# Patient Record
Sex: Female | Born: 1965 | Race: Black or African American | Hispanic: No | Marital: Single
Health system: Southern US, Community
[De-identification: ages and names within clinical notes are randomized; demographics above are authoritative.]

---

## 2004-09-02 ENCOUNTER — Emergency Department: Payer: Self-pay | Admitting: Emergency Medicine

## 2005-09-03 ENCOUNTER — Emergency Department: Payer: Self-pay | Admitting: Emergency Medicine

## 2006-09-26 ENCOUNTER — Emergency Department: Payer: Self-pay | Admitting: Emergency Medicine
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
## 2018-11-11 ENCOUNTER — Other Ambulatory Visit: Payer: Self-pay

## 2018-11-11 DIAGNOSIS — Z20822 Contact with and (suspected) exposure to covid-19: Secondary | ICD-10-CM

## 2018-11-12 LAB — NOVEL CORONAVIRUS, NAA: SARS-CoV-2, NAA: NOT DETECTED

## 2018-12-03 ENCOUNTER — Other Ambulatory Visit: Payer: Self-pay | Admitting: *Deleted

## 2018-12-03 DIAGNOSIS — Z20822 Contact with and (suspected) exposure to covid-19: Secondary | ICD-10-CM

## 2018-12-05 LAB — NOVEL CORONAVIRUS, NAA: SARS-CoV-2, NAA: NOT DETECTED

## 2018-12-07 ENCOUNTER — Telehealth: Payer: Self-pay | Admitting: General Practice

## 2018-12-07 NOTE — Telephone Encounter (Signed)
Negative COVID results given. Patient results "NOT Detected." Caller expressed understanding. ° °

## 2018-12-30 ENCOUNTER — Other Ambulatory Visit: Payer: Self-pay | Admitting: *Deleted

## 2018-12-30 DIAGNOSIS — Z20822 Contact with and (suspected) exposure to covid-19: Secondary | ICD-10-CM

## 2018-12-31 LAB — NOVEL CORONAVIRUS, NAA: SARS-CoV-2, NAA: NOT DETECTED

## 2019-01-04 ENCOUNTER — Telehealth: Payer: Self-pay | Admitting: General Practice

## 2019-01-04 NOTE — Telephone Encounter (Signed)
° °  Pt given neg COVID results  °

## 2019-01-25 ENCOUNTER — Other Ambulatory Visit: Payer: Self-pay

## 2019-01-25 DIAGNOSIS — Z20822 Contact with and (suspected) exposure to covid-19: Secondary | ICD-10-CM

## 2019-01-26 NOTE — Telephone Encounter (Signed)
Received call from patient checking Covid results.  Advised no resutls at this time.   

## 2019-01-27 LAB — NOVEL CORONAVIRUS, NAA: SARS-CoV-2, NAA: NOT DETECTED

## 2019-03-01 ENCOUNTER — Other Ambulatory Visit: Payer: Self-pay

## 2019-03-01 DIAGNOSIS — Z20822 Contact with and (suspected) exposure to covid-19: Secondary | ICD-10-CM

## 2019-03-02 ENCOUNTER — Telehealth: Payer: Self-pay

## 2019-03-02 LAB — NOVEL CORONAVIRUS, NAA: SARS-CoV-2, NAA: NOT DETECTED

## 2019-03-02 NOTE — Telephone Encounter (Signed)
Received call from patient checking Covid results.  Advised results negative.   

## 2019-04-15 ENCOUNTER — Other Ambulatory Visit: Payer: Self-pay

## 2019-04-16 ENCOUNTER — Ambulatory Visit: Payer: Self-pay | Attending: Internal Medicine

## 2019-04-16 DIAGNOSIS — Z20822 Contact with and (suspected) exposure to covid-19: Secondary | ICD-10-CM | POA: Insufficient documentation

## 2019-04-18 ENCOUNTER — Telehealth: Payer: Self-pay | Admitting: Hematology

## 2019-04-18 LAB — NOVEL CORONAVIRUS, NAA: SARS-CoV-2, NAA: NOT DETECTED

## 2019-04-18 NOTE — Telephone Encounter (Signed)
Pt is aware covid 19 test is neg on 04-09-2019

## 2019-05-03 ENCOUNTER — Ambulatory Visit: Payer: Self-pay | Attending: Internal Medicine

## 2019-05-03 DIAGNOSIS — Z20822 Contact with and (suspected) exposure to covid-19: Secondary | ICD-10-CM | POA: Insufficient documentation

## 2019-05-04 ENCOUNTER — Telehealth: Payer: Self-pay | Admitting: *Deleted

## 2019-05-04 LAB — NOVEL CORONAVIRUS, NAA: SARS-CoV-2, NAA: NOT DETECTED

## 2019-05-04 NOTE — Telephone Encounter (Signed)
Pt called to check status of covid 19 test results. She is advised that her results are not back at this time. She voiced understanding.

## 2019-05-06 ENCOUNTER — Telehealth: Payer: Self-pay | Admitting: *Deleted

## 2019-05-06 NOTE — Telephone Encounter (Signed)
Patient called to verify negative covid results. 

## 2019-05-31 ENCOUNTER — Ambulatory Visit: Payer: Self-pay

## 2019-05-31 ENCOUNTER — Ambulatory Visit: Payer: Self-pay | Attending: Internal Medicine

## 2019-05-31 DIAGNOSIS — Z20822 Contact with and (suspected) exposure to covid-19: Secondary | ICD-10-CM | POA: Insufficient documentation

## 2019-06-01 ENCOUNTER — Telehealth: Payer: Self-pay | Admitting: *Deleted

## 2019-06-01 LAB — SPECIMEN STATUS REPORT

## 2019-06-01 LAB — NOVEL CORONAVIRUS, NAA: SARS-CoV-2, NAA: NOT DETECTED

## 2019-06-01 NOTE — Telephone Encounter (Signed)
Patient called to request her Covid test result from 05/31/2019.  Patient informed covid test was negative.  Patient verbalized understanding.  Patient had no further questions.

## 2019-06-09 ENCOUNTER — Ambulatory Visit: Payer: Self-pay | Attending: Internal Medicine

## 2019-06-09 DIAGNOSIS — Z20822 Contact with and (suspected) exposure to covid-19: Secondary | ICD-10-CM | POA: Insufficient documentation

## 2019-06-10 LAB — NOVEL CORONAVIRUS, NAA: SARS-CoV-2, NAA: NOT DETECTED

## 2019-06-10 LAB — SPECIMEN STATUS REPORT

## 2019-06-24 ENCOUNTER — Ambulatory Visit: Payer: Self-pay | Attending: Internal Medicine

## 2019-06-24 DIAGNOSIS — Z20822 Contact with and (suspected) exposure to covid-19: Secondary | ICD-10-CM | POA: Insufficient documentation

## 2019-06-25 ENCOUNTER — Ambulatory Visit: Payer: Self-pay

## 2019-06-25 ENCOUNTER — Telehealth: Payer: Self-pay | Admitting: *Deleted

## 2019-06-25 ENCOUNTER — Ambulatory Visit: Payer: Self-pay | Attending: Internal Medicine

## 2019-06-25 DIAGNOSIS — Z23 Encounter for immunization: Secondary | ICD-10-CM

## 2019-06-25 NOTE — Progress Notes (Signed)
   Covid-19 Vaccination Clinic  Name:  Melody Flowers    MRN: [id number] DOB: 1965-05-03  06/25/2019  [name] was observed post Covid-19 immunization for 15 minutes without incident. She was provided with Vaccine Information Sheet and instruction to access the V-Safe system.   [name] was instructed to call 911 with any severe reactions post vaccine: Marland Kitchen Difficulty breathing  . Swelling of face and throat  . A fast heartbeat  . A bad rash all over body  . Dizziness and weakness   Immunizations Administered    Name Date Dose VIS Date Route   Pfizer COVID-19 Vaccine 06/25/2019  3:34 PM 0.3 mL 03/19/2019 Intramuscular   Manufacturer: [employer], [employer]   Lot: [id number]   NDC: [address]-2

## 2019-06-25 NOTE — Telephone Encounter (Signed)
Pt called for results of Covid test from yesterday, has not resulted, verbalized understanding.

## 2019-06-26 LAB — NOVEL CORONAVIRUS, NAA: SARS-CoV-2, NAA: NOT DETECTED

## 2019-07-19 ENCOUNTER — Other Ambulatory Visit: Payer: Self-pay

## 2019-07-21 ENCOUNTER — Ambulatory Visit: Payer: Self-pay | Attending: Internal Medicine

## 2019-07-21 DIAGNOSIS — Z23 Encounter for immunization: Secondary | ICD-10-CM

## 2019-07-21 NOTE — Progress Notes (Signed)
   Covid-19 Vaccination Clinic  Name:  Kortlyn Koltz    MRN: [id number] DOB: 1965-11-24  07/21/2019  Ms. Fromm was observed post Covid-19 immunization for 15 minutes without incident. She was provided with Vaccine Information Sheet and instruction to access the V-Safe system.   Ms. Guardiola was instructed to call 911 with any severe reactions post vaccine: Marland Kitchen Difficulty breathing  . Swelling of face and throat  . A fast heartbeat  . A bad rash all over body  . Dizziness and weakness   Immunizations Administered    Name Date Dose VIS Date Route   Pfizer COVID-19 Vaccine 07/21/2019  9:20 AM 0.3 mL 03/19/2019 Intramuscular   Manufacturer: [employer], [employer]   Lot: [id number]   NDC: [address]-7

## 2019-07-29 ENCOUNTER — Ambulatory Visit: Payer: Self-pay | Attending: Internal Medicine

## 2019-07-29 DIAGNOSIS — Z20822 Contact with and (suspected) exposure to covid-19: Secondary | ICD-10-CM | POA: Insufficient documentation

## 2019-07-30 LAB — NOVEL CORONAVIRUS, NAA: SARS-CoV-2, NAA: NOT DETECTED

## 2019-07-30 LAB — SARS-COV-2, NAA 2 DAY TAT

## 2019-07-31 ENCOUNTER — Telehealth: Payer: Self-pay

## 2019-07-31 NOTE — Telephone Encounter (Signed)

## 2019-09-01 ENCOUNTER — Other Ambulatory Visit: Payer: Self-pay

## 2019-09-01 ENCOUNTER — Ambulatory Visit: Payer: HRSA Program | Attending: Internal Medicine

## 2019-09-01 DIAGNOSIS — Z20822 Contact with and (suspected) exposure to covid-19: Secondary | ICD-10-CM | POA: Diagnosis not present

## 2019-09-02 ENCOUNTER — Telehealth: Payer: Self-pay | Admitting: General Practice

## 2019-09-02 LAB — NOVEL CORONAVIRUS, NAA: SARS-CoV-2, NAA: NOT DETECTED

## 2019-09-02 LAB — SARS-COV-2, NAA 2 DAY TAT

## 2019-09-02 NOTE — Telephone Encounter (Signed)
Negative COVID results given. Patient results "NOT Detected." Caller expressed understanding. ° °

## 2020-02-05 ENCOUNTER — Ambulatory Visit: Payer: Self-pay

## 2020-02-11 ENCOUNTER — Other Ambulatory Visit: Payer: Self-pay

## 2020-02-11 DIAGNOSIS — Z20822 Contact with and (suspected) exposure to covid-19: Secondary | ICD-10-CM

## 2020-02-12 LAB — SARS-COV-2, NAA 2 DAY TAT

## 2020-02-12 LAB — NOVEL CORONAVIRUS, NAA: SARS-CoV-2, NAA: NOT DETECTED

## 2020-02-22 ENCOUNTER — Ambulatory Visit: Payer: Self-pay

## 2020-04-12 ENCOUNTER — Other Ambulatory Visit: Payer: Self-pay

## 2020-04-12 DIAGNOSIS — Z20822 Contact with and (suspected) exposure to covid-19: Secondary | ICD-10-CM

## 2020-04-13 LAB — NOVEL CORONAVIRUS, NAA: SARS-CoV-2, NAA: NOT DETECTED

## 2020-04-13 LAB — SARS-COV-2, NAA 2 DAY TAT

## 2020-04-18 ENCOUNTER — Other Ambulatory Visit: Payer: Self-pay

## 2020-04-18 DIAGNOSIS — Z20822 Contact with and (suspected) exposure to covid-19: Secondary | ICD-10-CM

## 2020-04-19 ENCOUNTER — Other Ambulatory Visit: Payer: Self-pay

## 2020-04-22 LAB — NOVEL CORONAVIRUS, NAA: SARS-CoV-2, NAA: NOT DETECTED

## 2020-04-27 ENCOUNTER — Other Ambulatory Visit: Payer: Self-pay

## 2020-04-28 ENCOUNTER — Other Ambulatory Visit: Payer: Self-pay

## 2020-05-02 ENCOUNTER — Other Ambulatory Visit: Payer: Self-pay

## 2020-05-02 DIAGNOSIS — Z20822 Contact with and (suspected) exposure to covid-19: Secondary | ICD-10-CM

## 2020-05-04 LAB — SARS-COV-2, NAA 2 DAY TAT

## 2020-05-04 LAB — NOVEL CORONAVIRUS, NAA: SARS-CoV-2, NAA: NOT DETECTED

## 2020-08-25 ENCOUNTER — Other Ambulatory Visit: Payer: Self-pay

## 2020-08-25 ENCOUNTER — Ambulatory Visit: Payer: Self-pay | Attending: Internal Medicine

## 2020-08-25 DIAGNOSIS — Z23 Encounter for immunization: Secondary | ICD-10-CM

## 2020-08-25 MED ORDER — PFIZER-BIONT COVID-19 VAC-TRIS 30 MCG/0.3ML IM SUSP
INTRAMUSCULAR | 0 refills | Status: AC
  Filled 2020-08-25: qty 0.3, 1d supply, fill #0

## 2020-08-25 NOTE — Progress Notes (Signed)
   Covid-19 Vaccination Clinic  Name:  Melody Flowers    MRN: [id number] DOB: April 19, 1965  08/25/2020  Melody Flowers was observed post Covid-19 immunization for 15 minutes without incident. She was provided with Vaccine Information Sheet and instruction to access the V-Safe system.   Melody Flowers was instructed to call 911 with any severe reactions post vaccine: Marland Kitchen Difficulty breathing  . Swelling of face and throat  . A fast heartbeat  . A bad rash all over body  . Dizziness and weakness   Immunizations Administered    Name Date Dose VIS Date Route   PFIZER Comrnaty(Gray TOP) Covid-19 Vaccine 08/25/2020  2:59 PM 0.3 mL 03/16/2020 Intramuscular   Manufacturer: [employer], [employer]   Lot: [id number]   NDC: [address]-6     Drusilla Kanner, PharmD, MBA Clinical Acute Care Pharmacist

## 2021-10-17 ENCOUNTER — Other Ambulatory Visit: Payer: Self-pay
